# Patient Record
Sex: Female | Born: 1940 | Race: Black or African American | Hispanic: No | Smoking: Never smoker
Health system: Southern US, Community
[De-identification: ages and names within clinical notes are randomized; demographics above are authoritative.]

## PROBLEM LIST (undated history)

## (undated) DIAGNOSIS — J45909 Unspecified asthma, uncomplicated: Secondary | ICD-10-CM

## (undated) DIAGNOSIS — M542 Cervicalgia: Secondary | ICD-10-CM

## (undated) DIAGNOSIS — Z9889 Other specified postprocedural states: Secondary | ICD-10-CM

## (undated) DIAGNOSIS — I10 Essential (primary) hypertension: Secondary | ICD-10-CM

## (undated) DIAGNOSIS — R609 Edema, unspecified: Secondary | ICD-10-CM

## (undated) DIAGNOSIS — D649 Anemia, unspecified: Secondary | ICD-10-CM

## (undated) DIAGNOSIS — G8929 Other chronic pain: Secondary | ICD-10-CM

## (undated) DIAGNOSIS — R42 Dizziness and giddiness: Secondary | ICD-10-CM

## (undated) DIAGNOSIS — J302 Other seasonal allergic rhinitis: Secondary | ICD-10-CM

## (undated) DIAGNOSIS — R351 Nocturia: Secondary | ICD-10-CM

## (undated) DIAGNOSIS — E785 Hyperlipidemia, unspecified: Secondary | ICD-10-CM

## (undated) DIAGNOSIS — Z860100 Personal history of colon polyps, unspecified: Secondary | ICD-10-CM

## (undated) DIAGNOSIS — R531 Weakness: Secondary | ICD-10-CM

## (undated) DIAGNOSIS — E119 Type 2 diabetes mellitus without complications: Secondary | ICD-10-CM

## (undated) DIAGNOSIS — R600 Localized edema: Secondary | ICD-10-CM

## (undated) DIAGNOSIS — M2550 Pain in unspecified joint: Secondary | ICD-10-CM

## (undated) DIAGNOSIS — M1990 Unspecified osteoarthritis, unspecified site: Secondary | ICD-10-CM

## (undated) DIAGNOSIS — E039 Hypothyroidism, unspecified: Secondary | ICD-10-CM

## (undated) DIAGNOSIS — K219 Gastro-esophageal reflux disease without esophagitis: Secondary | ICD-10-CM

## (undated) DIAGNOSIS — R350 Frequency of micturition: Secondary | ICD-10-CM

## (undated) DIAGNOSIS — M549 Dorsalgia, unspecified: Secondary | ICD-10-CM

## (undated) DIAGNOSIS — M2540 Effusion, unspecified joint: Secondary | ICD-10-CM

## (undated) DIAGNOSIS — H269 Unspecified cataract: Secondary | ICD-10-CM

## (undated) DIAGNOSIS — R112 Nausea with vomiting, unspecified: Secondary | ICD-10-CM

## (undated) HISTORY — PX: CHOLECYSTECTOMY: SHX55

## (undated) HISTORY — PX: BREAST SURGERY: SHX581

## (undated) HISTORY — PX: DIAGNOSTIC LAPAROSCOPY: SUR761

## (undated) HISTORY — PX: COLONOSCOPY: SHX174

## (undated) HISTORY — PX: ABDOMINAL HYSTERECTOMY: SHX81

## (undated) HISTORY — PX: BREAST BIOPSY: SHX20

## (undated) HISTORY — PX: APPENDECTOMY: SHX54

---

## 2015-12-27 ENCOUNTER — Other Ambulatory Visit: Payer: Self-pay | Admitting: Neurosurgery

## 2016-01-10 ENCOUNTER — Encounter (HOSPITAL_COMMUNITY): Payer: Self-pay

## 2016-01-10 ENCOUNTER — Encounter (HOSPITAL_COMMUNITY)
Admission: RE | Admit: 2016-01-10 | Discharge: 2016-01-10 | Disposition: A | Discharge status: Home / Self Care | Payer: Medicare Other | Source: Ambulatory Visit | Attending: Neurosurgery | Admitting: Neurosurgery

## 2016-01-10 DIAGNOSIS — Z01812 Encounter for preprocedural laboratory examination: Secondary | ICD-10-CM | POA: Diagnosis present

## 2016-01-10 DIAGNOSIS — M4802 Spinal stenosis, cervical region: Secondary | ICD-10-CM | POA: Insufficient documentation

## 2016-01-10 HISTORY — DX: Other chronic pain: G89.29

## 2016-01-10 HISTORY — DX: Localized edema: R60.0

## 2016-01-10 HISTORY — DX: Cervicalgia: M54.2

## 2016-01-10 HISTORY — DX: Personal history of colonic polyps: Z86.010

## 2016-01-10 HISTORY — DX: Weakness: R53.1

## 2016-01-10 HISTORY — DX: Personal history of colon polyps, unspecified: Z86.0100

## 2016-01-10 HISTORY — DX: Gastro-esophageal reflux disease without esophagitis: K21.9

## 2016-01-10 HISTORY — DX: Type 2 diabetes mellitus without complications: E11.9

## 2016-01-10 HISTORY — DX: Hypothyroidism, unspecified: E03.9

## 2016-01-10 HISTORY — DX: Other specified postprocedural states: Z98.890

## 2016-01-10 HISTORY — DX: Other specified postprocedural states: R11.2

## 2016-01-10 HISTORY — DX: Unspecified asthma, uncomplicated: J45.909

## 2016-01-10 HISTORY — DX: Essential (primary) hypertension: I10

## 2016-01-10 HISTORY — DX: Dorsalgia, unspecified: M54.9

## 2016-01-10 HISTORY — DX: Hyperlipidemia, unspecified: E78.5

## 2016-01-10 HISTORY — DX: Frequency of micturition: R35.0

## 2016-01-10 HISTORY — DX: Nocturia: R35.1

## 2016-01-10 HISTORY — DX: Other seasonal allergic rhinitis: J30.2

## 2016-01-10 HISTORY — DX: Dizziness and giddiness: R42

## 2016-01-10 HISTORY — DX: Edema, unspecified: R60.9

## 2016-01-10 HISTORY — DX: Anemia, unspecified: D64.9

## 2016-01-10 HISTORY — DX: Unspecified cataract: H26.9

## 2016-01-10 HISTORY — DX: Pain in unspecified joint: M25.50

## 2016-01-10 HISTORY — DX: Effusion, unspecified joint: M25.40

## 2016-01-10 HISTORY — DX: Unspecified osteoarthritis, unspecified site: M19.90

## 2016-01-10 LAB — CBC
HCT: 35.1 % — ABNORMAL LOW (ref 36.0–46.0)
Hemoglobin: 11 g/dL — ABNORMAL LOW (ref 12.0–15.0)
MCH: 29 pg (ref 26.0–34.0)
MCHC: 31.3 g/dL (ref 30.0–36.0)
MCV: 92.6 fL (ref 78.0–100.0)
PLATELETS: 133 10*3/uL — AB (ref 150–400)
RBC: 3.79 MIL/uL — ABNORMAL LOW (ref 3.87–5.11)
RDW: 15.7 % — AB (ref 11.5–15.5)
WBC: 6.4 10*3/uL (ref 4.0–10.5)

## 2016-01-10 LAB — BASIC METABOLIC PANEL
Anion gap: 6 (ref 5–15)
BUN: 14 mg/dL (ref 6–20)
CHLORIDE: 114 mmol/L — AB (ref 101–111)
CO2: 20 mmol/L — ABNORMAL LOW (ref 22–32)
CREATININE: 1.08 mg/dL — AB (ref 0.44–1.00)
Calcium: 9.6 mg/dL (ref 8.9–10.3)
GFR calc Af Amer: 58 mL/min — ABNORMAL LOW (ref >60)
GFR, EST NON AFRICAN AMERICAN: 50 mL/min — AB (ref >60)
Glucose, Bld: 107 mg/dL — ABNORMAL HIGH (ref 65–99)
Potassium: 3.9 mmol/L (ref 3.5–5.1)
SODIUM: 140 mmol/L (ref 135–145)

## 2016-01-10 LAB — SURGICAL PCR SCREEN
MRSA, PCR: NEGATIVE
Staphylococcus aureus: POSITIVE — AB

## 2016-01-10 LAB — GLUCOSE, CAPILLARY: GLUCOSE-CAPILLARY: 93 mg/dL (ref 65–99)

## 2016-01-10 NOTE — Progress Notes (Signed)
Mupirocin script called into the [facility] in [place].Pt also notified of staph +

## 2016-01-10 NOTE — Pre-Procedure Instructions (Signed)
Dawn Joseph  01/10/2016      [facility] [address] [place] - [address] [place] [id number] Phone: [phone] Fax: [phone]    Your procedure is scheduled on Tues, June 20 @ 12:00 PM  [facility] Admitting at 9:00 AM  Call this number if you have problems the morning of surgery:  [phone]   Remember:  Do not eat food or drink liquids after midnight.  Take these medicines the morning of surgery with A SIP OF WATER Carvedilol(Coreg),Imdur(Isosorbide),Synthroid(Levothyroxine),Claritin(Loratadine),Meclizine(Antivert),and Omeprazole(Prilosec)              No Goody's,BC's,Aleve,Advil,Motrin,Ibuprofen,Fish Oil,or any Herbal Medications.    Do not wear jewelry, make-up or nail polish.  Do not wear lotions, powders, or perfumes.    Do not shave 48 hours prior to surgery.    Do not bring valuables to the hospital.  [facility] is not responsible for any belongings or valuables.  Contacts, dentures or bridgework may not be worn into surgery.  Leave your suitcase in the car.  After surgery it may be brought to your room.  For patients admitted to the hospital, discharge time will be determined by your treatment team.  Patients discharged the day of surgery will not be allowed to drive home.    [facility]  Before surgery, you can play an important role.  Because skin is not sterile, your skin needs to be as free of germs as possible.  You can reduce the number of germs on you skin by washing with CHG (chlorahexidine gluconate) soap before surgery.  CHG is an antiseptic cleaner which kills germs and bonds with the skin to continue killing germs even after washing.  Please DO NOT use if you have an allergy to CHG or antibacterial soaps.  If your skin becomes reddened/irritated stop using the CHG and inform your nurse when you arrive at Short Stay.  Do not shave (including legs and underarms) for  at least 48 hours prior to the first CHG shower.  You may shave your face.  Please follow these instructions carefully:   1.  Shower with CHG Soap the night before surgery and the                                morning of Surgery.  2.  If you choose to wash your hair, wash your hair first as usual with your       normal shampoo.  3.  After you shampoo, rinse your hair and body thoroughly to remove the                      Shampoo.  4.  Use CHG as you would any other liquid soap.  You can apply chg directly       to the skin and wash gently with scrungie or a clean washcloth.  5.  Apply the CHG Soap to your body ONLY FROM THE NECK DOWN.        Do not use on open wounds or open sores.  Avoid contact with your eyes,       ears, mouth and genitals (private parts).  Wash genitals (private parts)       with your normal soap.  6.  Wash thoroughly, paying special attention to the area where your surgery  will be performed.  7.  Thoroughly rinse your body with warm water from the neck down.  8.  DO NOT shower/wash with your normal soap after using and rinsing off       the CHG Soap.  9.  Pat yourself dry with a clean towel.            10.  Wear clean pajamas.            11.  Place clean sheets on your bed the night of your first shower and do not        sleep with pets.  Day of Surgery  Do not apply any lotions/deoderants the morning of surgery.  Please wear clean clothes to the hospital/surgery center.  :    Please read over the following fact sheets that you were given. MRSA Information

## 2016-01-10 NOTE — Progress Notes (Addendum)
Cardiologist is with [facility] in [place]. Saw around Jan 2017  Medical Md is Dr.Trevetti  Echo to request from [facility]  Stress test thinks > 5 yrs but will check with [facility]  Heart cath denies  EKG to request from [facility]  CXR denies

## 2016-01-11 LAB — HEMOGLOBIN A1C
Hgb A1c MFr Bld: 6.1 % — ABNORMAL HIGH (ref 4.8–5.6)
MEAN PLASMA GLUCOSE: 128 mg/dL

## 2016-01-16 MED ORDER — CEFAZOLIN SODIUM-DEXTROSE 2-4 GM/100ML-% IV SOLN
2.0000 g | INTRAVENOUS | Status: AC
  Administered 2016-01-17: 2 g via INTRAVENOUS
  Filled 2016-01-16: qty 100

## 2016-01-16 NOTE — Discharge Instructions (Signed)

## 2016-01-17 ENCOUNTER — Inpatient Hospital Stay (HOSPITAL_COMMUNITY): Payer: Medicare Other | Admitting: Certified Registered Nurse Anesthetist

## 2016-01-17 ENCOUNTER — Inpatient Hospital Stay (HOSPITAL_COMMUNITY): Payer: Medicare Other

## 2016-01-17 ENCOUNTER — Encounter (HOSPITAL_COMMUNITY)
Admission: RE | Disposition: A | Discharge status: Home Health | Payer: Self-pay | Source: Ambulatory Visit | Attending: Neurosurgery

## 2016-01-17 ENCOUNTER — Encounter (HOSPITAL_COMMUNITY): Payer: Self-pay | Admitting: Certified Registered Nurse Anesthetist

## 2016-01-17 ENCOUNTER — Inpatient Hospital Stay (HOSPITAL_COMMUNITY)
Admission: RE | Admit: 2016-01-17 | Discharge: 2016-01-18 | DRG: 472 | Disposition: A | Discharge status: Home Health | Payer: Medicare Other | Source: Ambulatory Visit | Attending: Neurosurgery | Admitting: Neurosurgery

## 2016-01-17 DIAGNOSIS — M4602 Spinal enthesopathy, cervical region: Secondary | ICD-10-CM | POA: Diagnosis present

## 2016-01-17 DIAGNOSIS — Z419 Encounter for procedure for purposes other than remedying health state, unspecified: Secondary | ICD-10-CM

## 2016-01-17 DIAGNOSIS — M50021 Cervical disc disorder at C4-C5 level with myelopathy: Secondary | ICD-10-CM | POA: Diagnosis present

## 2016-01-17 DIAGNOSIS — M5416 Radiculopathy, lumbar region: Secondary | ICD-10-CM | POA: Diagnosis present

## 2016-01-17 DIAGNOSIS — M109 Gout, unspecified: Secondary | ICD-10-CM | POA: Diagnosis present

## 2016-01-17 DIAGNOSIS — M1990 Unspecified osteoarthritis, unspecified site: Secondary | ICD-10-CM | POA: Diagnosis present

## 2016-01-17 DIAGNOSIS — M50121 Cervical disc disorder at C4-C5 level with radiculopathy: Secondary | ICD-10-CM | POA: Diagnosis present

## 2016-01-17 DIAGNOSIS — Z7982 Long term (current) use of aspirin: Secondary | ICD-10-CM | POA: Diagnosis not present

## 2016-01-17 DIAGNOSIS — M4802 Spinal stenosis, cervical region: Principal | ICD-10-CM | POA: Diagnosis present

## 2016-01-17 DIAGNOSIS — E119 Type 2 diabetes mellitus without complications: Secondary | ICD-10-CM | POA: Diagnosis present

## 2016-01-17 DIAGNOSIS — Z6839 Body mass index (BMI) 39.0-39.9, adult: Secondary | ICD-10-CM | POA: Diagnosis not present

## 2016-01-17 DIAGNOSIS — K219 Gastro-esophageal reflux disease without esophagitis: Secondary | ICD-10-CM | POA: Diagnosis present

## 2016-01-17 DIAGNOSIS — I10 Essential (primary) hypertension: Secondary | ICD-10-CM | POA: Diagnosis present

## 2016-01-17 DIAGNOSIS — Z79899 Other long term (current) drug therapy: Secondary | ICD-10-CM

## 2016-01-17 DIAGNOSIS — J45909 Unspecified asthma, uncomplicated: Secondary | ICD-10-CM | POA: Diagnosis present

## 2016-01-17 DIAGNOSIS — E669 Obesity, unspecified: Secondary | ICD-10-CM | POA: Diagnosis present

## 2016-01-17 DIAGNOSIS — M542 Cervicalgia: Secondary | ICD-10-CM | POA: Diagnosis present

## 2016-01-17 HISTORY — PX: ANTERIOR CERVICAL DECOMP/DISCECTOMY FUSION: SHX1161

## 2016-01-17 LAB — GLUCOSE, CAPILLARY
GLUCOSE-CAPILLARY: 190 mg/dL — AB (ref 65–99)
Glucose-Capillary: 108 mg/dL — ABNORMAL HIGH (ref 65–99)
Glucose-Capillary: 91 mg/dL (ref 65–99)
Glucose-Capillary: 134 mg/dL — ABNORMAL HIGH (ref 65–99)

## 2016-01-17 SURGERY — ANTERIOR CERVICAL DECOMPRESSION/DISCECTOMY FUSION 3 LEVELS
Anesthesia: General

## 2016-01-17 MED ORDER — SODIUM CHLORIDE 0.9 % IV SOLN: 250.0000 mL | INTRAVENOUS | Status: DC

## 2016-01-17 MED ORDER — ALUM & MAG HYDROXIDE-SIMETH 200-200-20 MG/5ML PO SUSP: 30.0000 mL | Freq: Four times a day (QID) | ORAL | Status: DC | PRN

## 2016-01-17 MED ORDER — HYDRALAZINE HCL 20 MG/ML IJ SOLN
INTRAMUSCULAR | Status: AC
  Filled 2016-01-17: qty 1

## 2016-01-17 MED ORDER — PANTOPRAZOLE SODIUM 40 MG PO TBEC
40.0000 mg | DELAYED_RELEASE_TABLET | Freq: Every day | ORAL | Status: DC
  Administered 2016-01-18: 40 mg via ORAL
  Filled 2016-01-17: qty 1

## 2016-01-17 MED ORDER — SODIUM CHLORIDE 0.9% FLUSH: 3.0000 mL | INTRAVENOUS | Status: DC | PRN

## 2016-01-17 MED ORDER — LIDOCAINE HCL 4 % EX SOLN
CUTANEOUS | Status: DC | PRN
  Administered 2016-01-17: 4 mL via TOPICAL

## 2016-01-17 MED ORDER — METHOCARBAMOL 500 MG PO TABS
500.0000 mg | ORAL_TABLET | Freq: Four times a day (QID) | ORAL | Status: DC | PRN
  Administered 2016-01-17 – 2016-01-18 (×3): 500 mg via ORAL
  Filled 2016-01-17 (×3): qty 1

## 2016-01-17 MED ORDER — MIDAZOLAM HCL 2 MG/2ML IJ SOLN
INTRAMUSCULAR | Status: AC
  Filled 2016-01-17: qty 2

## 2016-01-17 MED ORDER — FENTANYL CITRATE (PF) 100 MCG/2ML IJ SOLN
INTRAMUSCULAR | Status: AC
  Filled 2016-01-17: qty 2

## 2016-01-17 MED ORDER — FENTANYL CITRATE (PF) 100 MCG/2ML IJ SOLN
25.0000 ug | INTRAMUSCULAR | Status: DC | PRN
  Administered 2016-01-17: 25 ug via INTRAVENOUS
  Administered 2016-01-17: 50 ug via INTRAVENOUS

## 2016-01-17 MED ORDER — FLEET ENEMA 7-19 GM/118ML RE ENEM: 1.0000 | ENEMA | Freq: Once | RECTAL | Status: DC | PRN

## 2016-01-17 MED ORDER — HYDRALAZINE HCL 20 MG/ML IJ SOLN: 5.0000 mg | INTRAMUSCULAR | Status: DC

## 2016-01-17 MED ORDER — ISOSORBIDE MONONITRATE ER 60 MG PO TB24
60.0000 mg | ORAL_TABLET | Freq: Every day | ORAL | Status: DC
  Administered 2016-01-17: 60 mg via ORAL
  Filled 2016-01-17: qty 1

## 2016-01-17 MED ORDER — DOCUSATE SODIUM 100 MG PO CAPS
100.0000 mg | ORAL_CAPSULE | Freq: Two times a day (BID) | ORAL | Status: DC
  Administered 2016-01-17 – 2016-01-18 (×2): 100 mg via ORAL
  Filled 2016-01-17 (×2): qty 1

## 2016-01-17 MED ORDER — ONDANSETRON HCL 4 MG/2ML IJ SOLN: 4.0000 mg | Freq: Once | INTRAMUSCULAR | Status: DC | PRN

## 2016-01-17 MED ORDER — LIDOCAINE HCL (CARDIAC) 20 MG/ML IV SOLN
INTRAVENOUS | Status: DC | PRN
  Administered 2016-01-17: 100 mg via INTRAVENOUS

## 2016-01-17 MED ORDER — ASPIRIN EC 81 MG PO TBEC
81.0000 mg | DELAYED_RELEASE_TABLET | Freq: Every day | ORAL | Status: DC
  Administered 2016-01-17 – 2016-01-18 (×2): 81 mg via ORAL
  Filled 2016-01-17 (×2): qty 1

## 2016-01-17 MED ORDER — PROPOFOL 10 MG/ML IV BOLUS
INTRAVENOUS | Status: DC | PRN
  Administered 2016-01-17: 100 mg via INTRAVENOUS

## 2016-01-17 MED ORDER — OXYCODONE-ACETAMINOPHEN 5-325 MG PO TABS
1.0000 | ORAL_TABLET | ORAL | Status: DC | PRN
  Administered 2016-01-17: 2 via ORAL
  Administered 2016-01-18: 1 via ORAL
  Administered 2016-01-18: 2 via ORAL
  Filled 2016-01-17: qty 2
  Filled 2016-01-17: qty 1
  Filled 2016-01-17: qty 2

## 2016-01-17 MED ORDER — HYDROCODONE-ACETAMINOPHEN 5-325 MG PO TABS: 1.0000 | ORAL_TABLET | ORAL | Status: DC | PRN

## 2016-01-17 MED ORDER — HYDRALAZINE HCL 20 MG/ML IJ SOLN
INTRAMUSCULAR | Status: DC | PRN
  Administered 2016-01-17 (×2): 5 mg via INTRAVENOUS

## 2016-01-17 MED ORDER — FENTANYL CITRATE (PF) 100 MCG/2ML IJ SOLN
INTRAMUSCULAR | Status: DC | PRN
  Administered 2016-01-17: 50 ug via INTRAVENOUS
  Administered 2016-01-17: 100 ug via INTRAVENOUS
  Administered 2016-01-17: 50 ug via INTRAVENOUS

## 2016-01-17 MED ORDER — CYANOCOBALAMIN 500 MCG PO TABS
500.0000 ug | ORAL_TABLET | Freq: Every day | ORAL | Status: DC
  Administered 2016-01-17 – 2016-01-18 (×2): 500 ug via ORAL
  Filled 2016-01-17 (×2): qty 1

## 2016-01-17 MED ORDER — ISOSORBIDE MONONITRATE ER 60 MG PO TB24: 120.0000 mg | ORAL_TABLET | Freq: Every day | ORAL | Status: DC

## 2016-01-17 MED ORDER — MECLIZINE HCL 12.5 MG PO TABS
12.5000 mg | ORAL_TABLET | Freq: Every day | ORAL | Status: DC
  Administered 2016-01-18: 12.5 mg via ORAL
  Filled 2016-01-17: qty 1

## 2016-01-17 MED ORDER — LACTATED RINGERS IV SOLN
INTRAVENOUS | Status: DC
  Administered 2016-01-17 (×2): via INTRAVENOUS

## 2016-01-17 MED ORDER — 0.9 % SODIUM CHLORIDE (POUR BTL) OPTIME
TOPICAL | Status: DC | PRN
  Administered 2016-01-17: 1000 mL

## 2016-01-17 MED ORDER — BISACODYL 10 MG RE SUPP: 10.0000 mg | Freq: Every day | RECTAL | Status: DC | PRN

## 2016-01-17 MED ORDER — THROMBIN 20000 UNITS EX SOLR
CUTANEOUS | Status: DC | PRN
  Administered 2016-01-17: 14:00:00 via TOPICAL

## 2016-01-17 MED ORDER — PANCRELIPASE (LIP-PROT-AMYL) 12000-38000 UNITS PO CPEP
2.0000 | ORAL_CAPSULE | Freq: Every day | ORAL | Status: DC
  Filled 2016-01-17: qty 2

## 2016-01-17 MED ORDER — ACETAMINOPHEN 325 MG PO TABS: 650.0000 mg | ORAL_TABLET | ORAL | Status: DC | PRN

## 2016-01-17 MED ORDER — FERROUS SULFATE 325 (65 FE) MG PO TABS
325.0000 mg | ORAL_TABLET | Freq: Two times a day (BID) | ORAL | Status: DC
  Administered 2016-01-18: 325 mg via ORAL
  Filled 2016-01-17: qty 1

## 2016-01-17 MED ORDER — NATEGLINIDE 120 MG PO TABS
120.0000 mg | ORAL_TABLET | Freq: Two times a day (BID) | ORAL | Status: DC
  Administered 2016-01-18: 120 mg via ORAL
  Filled 2016-01-17 (×2): qty 1

## 2016-01-17 MED ORDER — LIDOCAINE-EPINEPHRINE 1 %-1:100000 IJ SOLN
INTRAMUSCULAR | Status: DC | PRN
  Administered 2016-01-17: 7 mL

## 2016-01-17 MED ORDER — CEFAZOLIN SODIUM 1-5 GM-% IV SOLN
1.0000 g | Freq: Three times a day (TID) | INTRAVENOUS | Status: AC
  Administered 2016-01-17 – 2016-01-18 (×2): 1 g via INTRAVENOUS
  Filled 2016-01-17 (×2): qty 50

## 2016-01-17 MED ORDER — PROPOFOL 10 MG/ML IV BOLUS
INTRAVENOUS | Status: AC
  Filled 2016-01-17: qty 20

## 2016-01-17 MED ORDER — ZOLPIDEM TARTRATE 5 MG PO TABS: 5.0000 mg | ORAL_TABLET | Freq: Every evening | ORAL | Status: DC | PRN

## 2016-01-17 MED ORDER — CO Q 10 100 MG PO CAPS: 1.0000 | ORAL_CAPSULE | Freq: Every day | ORAL | Status: DC

## 2016-01-17 MED ORDER — ONDANSETRON HCL 4 MG/2ML IJ SOLN
INTRAMUSCULAR | Status: DC | PRN
  Administered 2016-01-17: 4 mg via INTRAVENOUS

## 2016-01-17 MED ORDER — BUPIVACAINE HCL (PF) 0.5 % IJ SOLN
INTRAMUSCULAR | Status: DC | PRN
  Administered 2016-01-17: 7 mL

## 2016-01-17 MED ORDER — ONDANSETRON HCL 4 MG/2ML IJ SOLN: 4.0000 mg | INTRAMUSCULAR | Status: DC | PRN

## 2016-01-17 MED ORDER — FUROSEMIDE 20 MG PO TABS
20.0000 mg | ORAL_TABLET | Freq: Every day | ORAL | Status: DC
  Administered 2016-01-18: 20 mg via ORAL
  Filled 2016-01-17: qty 1

## 2016-01-17 MED ORDER — GLYCOPYRROLATE 0.2 MG/ML IJ SOLN
INTRAMUSCULAR | Status: DC | PRN
  Administered 2016-01-17: 0.4 mg via INTRAVENOUS

## 2016-01-17 MED ORDER — CARVEDILOL 6.25 MG PO TABS
25.0000 mg | ORAL_TABLET | Freq: Two times a day (BID) | ORAL | Status: DC
  Administered 2016-01-17 – 2016-01-18 (×2): 25 mg via ORAL
  Filled 2016-01-17 (×2): qty 4

## 2016-01-17 MED ORDER — ATORVASTATIN CALCIUM 20 MG PO TABS
40.0000 mg | ORAL_TABLET | Freq: Every day | ORAL | Status: DC
  Administered 2016-01-17 – 2016-01-18 (×2): 40 mg via ORAL
  Filled 2016-01-17 (×2): qty 2

## 2016-01-17 MED ORDER — HYDROMORPHONE HCL 1 MG/ML IJ SOLN: 0.5000 mg | INTRAMUSCULAR | Status: DC | PRN

## 2016-01-17 MED ORDER — PHENOL 1.4 % MT LIQD: 1.0000 | OROMUCOSAL | Status: DC | PRN

## 2016-01-17 MED ORDER — LORATADINE 10 MG PO TABS
10.0000 mg | ORAL_TABLET | Freq: Every day | ORAL | Status: DC
  Administered 2016-01-18: 10 mg via ORAL
  Filled 2016-01-17: qty 1

## 2016-01-17 MED ORDER — SODIUM CHLORIDE 0.9% FLUSH
3.0000 mL | Freq: Two times a day (BID) | INTRAVENOUS | Status: DC
  Administered 2016-01-17: 3 mL via INTRAVENOUS

## 2016-01-17 MED ORDER — THROMBIN 5000 UNITS EX SOLR
OROMUCOSAL | Status: DC | PRN
  Administered 2016-01-17: 15:00:00 via TOPICAL

## 2016-01-17 MED ORDER — FAMOTIDINE IN NACL 20-0.9 MG/50ML-% IV SOLN
20.0000 mg | Freq: Two times a day (BID) | INTRAVENOUS | Status: DC
  Administered 2016-01-17: 20 mg via INTRAVENOUS
  Filled 2016-01-17 (×3): qty 50

## 2016-01-17 MED ORDER — VITAMIN D 1000 UNITS PO TABS
2000.0000 [IU] | ORAL_TABLET | Freq: Every day | ORAL | Status: DC
  Administered 2016-01-17 – 2016-01-18 (×2): 2000 [IU] via ORAL
  Filled 2016-01-17: qty 2

## 2016-01-17 MED ORDER — MENTHOL 3 MG MT LOZG: 1.0000 | LOZENGE | OROMUCOSAL | Status: DC | PRN

## 2016-01-17 MED ORDER — ROCURONIUM BROMIDE 100 MG/10ML IV SOLN
INTRAVENOUS | Status: DC | PRN
  Administered 2016-01-17: 20 mg via INTRAVENOUS
  Administered 2016-01-17: 50 mg via INTRAVENOUS

## 2016-01-17 MED ORDER — DEXAMETHASONE SODIUM PHOSPHATE 4 MG/ML IJ SOLN
INTRAMUSCULAR | Status: DC | PRN
  Administered 2016-01-17: 10 mg via INTRAVENOUS

## 2016-01-17 MED ORDER — KCL IN DEXTROSE-NACL 20-5-0.45 MEQ/L-%-% IV SOLN: INTRAVENOUS | Status: DC

## 2016-01-17 MED ORDER — ACETAMINOPHEN 650 MG RE SUPP: 650.0000 mg | RECTAL | Status: DC | PRN

## 2016-01-17 MED ORDER — DEXTROSE 5 % IV SOLN
10.0000 mg | INTRAVENOUS | Status: DC | PRN
  Administered 2016-01-17: 15 ug/min via INTRAVENOUS

## 2016-01-17 MED ORDER — LEVOTHYROXINE SODIUM 100 MCG PO TABS
50.0000 ug | ORAL_TABLET | Freq: Every day | ORAL | Status: DC
  Administered 2016-01-18: 50 ug via ORAL
  Filled 2016-01-17: qty 1

## 2016-01-17 MED ORDER — SENNOSIDES-DOCUSATE SODIUM 8.6-50 MG PO TABS: 1.0000 | ORAL_TABLET | Freq: Every evening | ORAL | Status: DC | PRN

## 2016-01-17 MED ORDER — SUGAMMADEX SODIUM 200 MG/2ML IV SOLN
INTRAVENOUS | Status: DC | PRN
  Administered 2016-01-17: 200 mg via INTRAVENOUS

## 2016-01-17 MED ORDER — DEXTROSE 5 % IV SOLN: 10.0000 mg | INTRAVENOUS | Status: DC | PRN

## 2016-01-17 MED ORDER — FENTANYL CITRATE (PF) 250 MCG/5ML IJ SOLN
INTRAMUSCULAR | Status: AC
  Filled 2016-01-17: qty 5

## 2016-01-17 MED ORDER — METHOCARBAMOL 1000 MG/10ML IJ SOLN
500.0000 mg | Freq: Four times a day (QID) | INTRAVENOUS | Status: DC | PRN
  Filled 2016-01-17: qty 5

## 2016-01-17 MED ORDER — ACETAZOLAMIDE 250 MG PO TABS
250.0000 mg | ORAL_TABLET | Freq: Every day | ORAL | Status: DC
  Administered 2016-01-17 – 2016-01-18 (×2): 250 mg via ORAL
  Filled 2016-01-17 (×2): qty 1

## 2016-01-17 MED ORDER — POTASSIUM CHLORIDE ER 10 MEQ PO TBCR
10.0000 meq | EXTENDED_RELEASE_TABLET | Freq: Every day | ORAL | Status: DC
  Administered 2016-01-17 – 2016-01-18 (×2): 10 meq via ORAL
  Filled 2016-01-17 (×3): qty 1

## 2016-01-17 SURGICAL SUPPLY — 75 items
BENZOIN TINCTURE PRP APPL 2/3 (GAUZE/BANDAGES/DRESSINGS) ×3 IMPLANT
BIT DRILL 12X2.5XAVTR (BIT) ×1 IMPLANT
BIT DRILL AVIATOR 12 (BIT) ×1
BIT DRILL NEURO 2X3.1 SFT TUCH (MISCELLANEOUS) ×1 IMPLANT
BIT DRL 12X2.5XAVTR (BIT) ×1
BLADE ULTRA TIP 2M (BLADE) IMPLANT
BNDG GAUZE ELAST 4 BULKY (GAUZE/BANDAGES/DRESSINGS) IMPLANT
BUR BARREL STRAIGHT FLUTE 4.0 (BURR) ×6 IMPLANT
CANISTER SUCT 3000ML PPV (MISCELLANEOUS) ×3 IMPLANT
CLOSURE WOUND 1/2 X4 (GAUZE/BANDAGES/DRESSINGS) ×1
COVER MAYO STAND STRL (DRAPES) ×3 IMPLANT
DECANTER SPIKE VIAL GLASS SM (MISCELLANEOUS) ×3 IMPLANT
DERMABOND ADVANCED (GAUZE/BANDAGES/DRESSINGS) ×2
DERMABOND ADVANCED .7 DNX12 (GAUZE/BANDAGES/DRESSINGS) ×1 IMPLANT
DRAPE LAPAROTOMY 100X72 PEDS (DRAPES) ×3 IMPLANT
DRAPE MICROSCOPE LEICA (MISCELLANEOUS) ×3 IMPLANT
DRAPE POUCH INSTRU U-SHP 10X18 (DRAPES) ×3 IMPLANT
DRAPE PROXIMA HALF (DRAPES) IMPLANT
DRILL NEURO 2X3.1 SOFT TOUCH (MISCELLANEOUS) ×3
DRSG OPSITE POSTOP 3X4 (GAUZE/BANDAGES/DRESSINGS) ×3 IMPLANT
DURAPREP 6ML APPLICATOR 50/CS (WOUND CARE) ×3 IMPLANT
ELECT COATED BLADE 2.86 ST (ELECTRODE) ×3 IMPLANT
ELECT REM PT RETURN 9FT ADLT (ELECTROSURGICAL) ×3
ELECTRODE REM PT RTRN 9FT ADLT (ELECTROSURGICAL) ×1 IMPLANT
GAUZE SPONGE 4X4 12PLY STRL (GAUZE/BANDAGES/DRESSINGS) IMPLANT
GAUZE SPONGE 4X4 16PLY XRAY LF (GAUZE/BANDAGES/DRESSINGS) IMPLANT
GLOVE BIO SURGEON STRL SZ8 (GLOVE) ×3 IMPLANT
GLOVE BIOGEL PI IND STRL 6.5 (GLOVE) ×2 IMPLANT
GLOVE BIOGEL PI IND STRL 8 (GLOVE) ×1 IMPLANT
GLOVE BIOGEL PI IND STRL 8.5 (GLOVE) ×1 IMPLANT
GLOVE BIOGEL PI INDICATOR 6.5 (GLOVE) ×4
GLOVE BIOGEL PI INDICATOR 8 (GLOVE) ×2
GLOVE BIOGEL PI INDICATOR 8.5 (GLOVE) ×2
GLOVE ECLIPSE 7.0 STRL STRAW (GLOVE) ×3 IMPLANT
GLOVE ECLIPSE 8.0 STRL XLNG CF (GLOVE) ×3 IMPLANT
GLOVE EXAM NITRILE LRG STRL (GLOVE) IMPLANT
GLOVE EXAM NITRILE MD LF STRL (GLOVE) IMPLANT
GLOVE EXAM NITRILE XL STR (GLOVE) IMPLANT
GLOVE EXAM NITRILE XS STR PU (GLOVE) IMPLANT
GLOVE INDICATOR 7.0 STRL GRN (GLOVE) ×12 IMPLANT
GLOVE INDICATOR 7.5 STRL GRN (GLOVE) ×6 IMPLANT
GOWN STRL REUS W/ TWL LRG LVL3 (GOWN DISPOSABLE) ×1 IMPLANT
GOWN STRL REUS W/ TWL XL LVL3 (GOWN DISPOSABLE) ×1 IMPLANT
GOWN STRL REUS W/TWL 2XL LVL3 (GOWN DISPOSABLE) ×3 IMPLANT
GOWN STRL REUS W/TWL LRG LVL3 (GOWN DISPOSABLE) ×2
GOWN STRL REUS W/TWL XL LVL3 (GOWN DISPOSABLE) ×2
HALTER HD/CHIN CERV TRACTION D (MISCELLANEOUS) ×3 IMPLANT
HEMOSTAT POWDER SURGIFOAM 1G (HEMOSTASIS) ×3 IMPLANT
KIT BASIN OR (CUSTOM PROCEDURE TRAY) ×3 IMPLANT
KIT ROOM TURNOVER OR (KITS) ×3 IMPLANT
NEEDLE HYPO 25X1 1.5 SAFETY (NEEDLE) ×3 IMPLANT
NEEDLE SPNL 18GX3.5 QUINCKE PK (NEEDLE) ×3 IMPLANT
NEEDLE SPNL 22GX3.5 QUINCKE BK (NEEDLE) ×6 IMPLANT
NS IRRIG 1000ML POUR BTL (IV SOLUTION) ×3 IMPLANT
PACK LAMINECTOMY NEURO (CUSTOM PROCEDURE TRAY) ×3 IMPLANT
PAD ARMBOARD 7.5X6 YLW CONV (MISCELLANEOUS) ×9 IMPLANT
PIN DISTRACTION 14MM (PIN) ×6 IMPLANT
PLATE AVIATOR ASSY 3LVL SZ 42 (Plate) ×3 IMPLANT
PUTTY DBX 1CC (Putty) ×3 IMPLANT
PUTTY DBX 1CC DEPUY (Putty) ×1 IMPLANT
RUBBERBAND STERILE (MISCELLANEOUS) ×6 IMPLANT
SCREW AVIATOR VAR SELFTAP 4X12 (Screw) ×24 IMPLANT
SPACER AVS AS 12X14X5X0 (Orthopedic Implant) ×6 IMPLANT
SPACER AVS AS 4X12X14X4 (Spacer) ×3 IMPLANT
SPONGE INTESTINAL PEANUT (DISPOSABLE) ×6 IMPLANT
SPONGE SURGIFOAM ABS GEL 100 (HEMOSTASIS) ×3 IMPLANT
STAPLER SKIN PROX WIDE 3.9 (STAPLE) IMPLANT
STRIP CLOSURE SKIN 1/2X4 (GAUZE/BANDAGES/DRESSINGS) ×2 IMPLANT
SUT VIC AB 3-0 SH 8-18 (SUTURE) ×3 IMPLANT
SYR 5ML LL (SYRINGE) IMPLANT
TOWEL OR 17X24 6PK STRL BLUE (TOWEL DISPOSABLE) ×3 IMPLANT
TOWEL OR 17X26 10 PK STRL BLUE (TOWEL DISPOSABLE) ×3 IMPLANT
TRAP SPECIMEN MUCOUS 40CC (MISCELLANEOUS) ×3 IMPLANT
TRAY FOLEY W/METER SILVER 16FR (SET/KITS/TRAYS/PACK) IMPLANT
WATER STERILE IRR 1000ML POUR (IV SOLUTION) ×3 IMPLANT

## 2016-01-17 NOTE — Anesthesia Preprocedure Evaluation (Addendum)
Anesthesia Evaluation  Patient identified by MRN, date of birth, ID band Patient awake    Reviewed: Allergy & Precautions, NPO status , Patient's Chart, lab work & pertinent test results, reviewed documented beta blocker date and time   History of Anesthesia Complications (+) PONV and history of anesthetic complications  Airway Mallampati: II  TM Distance: >3 FB Neck ROM: Limited    Dental  (+) Teeth Intact, Dental Advisory Given, Missing   Pulmonary asthma (albuterol PRN) ,    Pulmonary exam normal breath sounds clear to auscultation       Cardiovascular hypertension, Pt. on home beta blockers and Pt. on medications Normal cardiovascular exam Rhythm:Regular Rate:Normal     Neuro/Psych Cervical stenosis BUE weakness  negative neurological ROS  negative psych ROS   GI/Hepatic Neg liver ROS, GERD  Medicated,  Endo/Other  diabetes, Type 2, Oral Hypoglycemic AgentsHypothyroidism Obesity   Renal/GU negative Renal ROS     Musculoskeletal  (+) Arthritis ,   Abdominal   Peds  Hematology  (+) Blood dyscrasia (thrombocytopenia), anemia ,   Anesthesia Other Findings Day of surgery medications reviewed with the patient.  Reproductive/Obstetrics                           Anesthesia Physical Anesthesia Plan  ASA: III  Anesthesia Plan: General   Post-op Pain Management:    Induction: Intravenous  Airway Management Planned: Oral ETT and Video Laryngoscope Planned  Additional Equipment:   Intra-op Plan:   Post-operative Plan: Extubation in OR  Informed Consent: I have reviewed the patients History and Physical, chart, labs and discussed the procedure including the risks, benefits and alternatives for the proposed anesthesia with the patient or authorized representative who has indicated his/her understanding and acceptance.   Dental advisory given  Plan Discussed with: CRNA  Anesthesia  Plan Comments: (Risks/benefits of general anesthesia discussed with patient including risk of damage to teeth, lips, gum, and tongue, nausea/vomiting, allergic reactions to medications, and the possibility of heart attack, stroke and death.  All patient questions answered.  Patient wishes to proceed.)       Anesthesia Quick Evaluation

## 2016-01-17 NOTE — Progress Notes (Signed)
Awake, alert, conversant.  Full strength both upper extremities, D/B/T.  MAEW.   Doing well.

## 2016-01-17 NOTE — Transfer of Care (Signed)
Immediate Anesthesia Transfer of Care Note  Patient: Dawn Joseph  Procedure(s) Performed: Procedure(s): Cervical four-five Cervical five-six Cervical six-seven Anterior cervical decompression/diskectomy/fusion (N/A)  Patient Location: PACU  Anesthesia Type:General  Level of Consciousness: awake, alert  and oriented  Airway & Oxygen Therapy: Patient Spontanous Breathing and Patient connected to nasal cannula oxygen  Post-op Assessment: Report given to RN and Post -op Vital signs reviewed and stable  Post vital signs: Reviewed and stable  Last Vitals:  Filed Vitals:   01/17/16 1705 01/17/16 1708  BP:  173/74  Pulse: 59 57  Temp: 36.3 C   Resp:  17    Last Pain:  Filed Vitals:   01/17/16 1710  PainSc: 5       Patients Stated Pain Goal: 3 (01/17/16 1115)  Complications: No apparent anesthesia complications

## 2016-01-17 NOTE — Interval H&P Note (Signed)
History and Physical Interval Note:  01/17/2016 2:22 PM  Dawn Joseph  has presented today for surgery, with the diagnosis of Cervical stenosis of spinal canal  The various methods of treatment have been discussed with the patient and family. After consideration of risks, benefits and other options for treatment, the patient has consented to  Procedure(s) with comments: C4-5 C5-6 C6-7 Anterior cervical decompression/diskectomy/fusion (N/A) - C4-5 C5-6 C6-7 Anterior cervical decompression/diskectomy/fusion as a surgical intervention .  The patient's history has been reviewed, patient examined, no change in status, stable for surgery.  I have reviewed the patient's chart and labs.  Questions were answered to the patient's satisfaction.     Samella Lucchetti D

## 2016-01-17 NOTE — H&P (Signed)
Patient ID:   [id number] Patient: Dawn Joseph  Date of Birth: 07-09-1941 Visit Type: Office Visit   Date: 12/07/2015 10:45 AM Provider: Danae Orleans. Venetia Maxon MD   This 75 year old female presents for Low back pain and neck pain.  History of Present Illness: 1.  Low back pain  2.  neck pain    Patient returns to review her MRI.   She has hyperflexia and weakness in bilateral finger extensors and hand intrinsics. Positive Hoffman's bilaterally. She has also fallen 4 times due to her weakness.she is hyperreflexic in both knees and has spasticity in her legs  11/11/15 MRI: Multilevel degenerative changes with severe neuroforaminal narrowing extending from C4 through C7. In addition, there is moderate spinal stenosis at C4-C5, C5-C6, and C6-C7. Overall these are relatively stable.      Medical/Surgical/Interim History Reviewed, no change.   PAST MEDICAL HISTORY, SURGICAL HISTORY, FAMILY HISTORY, SOCIAL HISTORY AND REVIEW OF SYSTEMS  10/26/2015, which I have signed.  Family History: Reviewed, no changes.    Social History: Tobacco use reviewed. Reviewed, no changes.     MEDICATIONS(added, continued or stopped this visit): Started Medication Directions Instruction Stopped   acetazolamide 250 mg tablet take 1 tablet by oral route 4 times every day     aspirin 81 mg tablet,delayed release take 1 tablet by oral route  every day     atorvastatin 40 mg tablet take 1 tablet by oral route  every day     baclofen 10 mg tablet take 1 tablet by oral route 3 times every day     carvedilol 25 mg tablet take 1 tablet by oral route 2 times every day with food     Co Q-10 100 mg capsule      Fish Oil 1,000 mg (120 mg-180 mg) capsule take 1 by oral route  every day     Flonase Allergy Relief 50 mcg/actuation nasal spray,suspension spray 1 - 2 spray by intranasal route  every day in each nostril as needed     furosemide 40 mg tablet take 1 tablet by oral route  every day     Gralise 300 mg  tablet,extended release take 1 tablet daily by mouth     isosorbide mononitrate ER 60 mg tablet,extended release 24 hr take 1 tablet by oral route  every day in the morning     levothyroxine 50 mcg tablet take 1 tablet by oral route  every day     loratadine 10 mg disintegrating tablet take 1 tablet by oral route  every day and place on top of the tongue where it will dissolve, then swallow     losartan potassium  ORAL      meclizine 25 mg tablet take 1 tablet by oral route 3 times every day as needed     nateglinide 120 mg tablet take 1 tablet by oral route 3 times every day 1 to 30 minutes prior to meals     potassium chloride ER 10 mEq capsule,extended release take 4 capsule by oral route 2 times every day with food     Vitamin B-12 500 mcg tablet      Vitamin D3 2,000 unit capsule        ALLERGIES: Ingredient Reaction Medication Name Comment  CODEINE Unknown        Vitals Date Temp F BP Pulse Ht In Wt Lb BMI BSA Pain Score  12/07/2015  173/84 53 59.25 198 39.65  8/10  IMPRESSION The patient is experiencing neck and low back pain. On review of her cervical MRI, she has moderate spinal stenosis at the C4-7 levels, which is causing her hyperflexia and weakness in UE and LE. With the severity of the stenosis and worsening symptoms, I recommend surgical intervention at this time.  Completed Orders (this encounter) Order Details Reason Side Interpretation Result Initial Treatment Date Region  Dietary management education, guidance, and counseling Encouraged to eat a well balanced diet and follow up with primary care physician.        Hypertension education Follow up with primary care physician for elevated blood pressure.         Assessment/Plan # Detail Type Description   1. Assessment Body mass index (BMI) 39.0-39.9, adult (Z68.39).   Plan Orders Today's instructions / counseling include(s) Dietary management education, guidance, and counseling.       2. Assessment  Essential (primary) hypertension (I10).       3. Assessment Cervical stenosis of spinal canal (M48.02).         Pain Assessment/Treatment Pain Scale: 8/10. Method: Numeric Pain Intensity Scale. Location: lower back. Onset: 06/28/2015. Duration: varies. Quality: discomforting. Pain Assessment/Treatment follow-up plan of care: Patient is taking medications prescribed.  Fall Risk Plan The Patient has fallen 4 times in the last year. The fall(s) resulted in injury. Details: bruising. Falls risk follow-up plan of care: Assisted devices: Advised to use cane/walker for safety.  Scheduled C4-7 ACDF. Nurse education given.   Orders: Diagnostic Procedures: Assessment Procedure  M48.02 Cervical Spine- Lateral  Instruction(s)/Education: Assessment Instruction  I10 Hypertension education  Z68.39 Dietary management education, guidance, and counseling             Provider:  Danae OrleansJoseph D. Venetia MaxonStern MD  12/07/2015 10:56 AM Dictation edited by: Gabriel RainwaterSamuel F. McBride    CC Providers: Collene LeydenJames Dailey [address] [place], [place] [id number]-              Electronically signed by Danae OrleansJoseph D. Venetia MaxonStern MD on 12/07/2015 11:05 AM  Patient ID:   [id number] Patient: Dawn Joseph  Date of Birth: 05/06/1941 Visit Type: Office Visit   Date: 10/26/2015 02:30 PM Provider: Danae OrleansJoseph D. Venetia MaxonStern MD   This 75 year old female presents for Low back pain.  History of Present Illness: 1.  Low back pain    [name], 75 year old female visits for evaluation of neck, arm, back, & right leg pain.  Patient notes several years of neck pain, lumbar pain began in November 2016.  She recalls no injuries.  Physical therapy offered no relief for her neck Cervical ESI's 2015 offered no lasting relief Lumbar ESI's January 2017 offered no relief  Norco 7.5/325 was changed to Percocet which patient did not tolerate Baclofen 10 mg is noted on referral paperwork; however patient states she no longer  takes  Tylenol 500 mg 2-3 3 times a day  History: RA, NIDDM, GERD, thyroid, asthma, gout, HTN Surgical history: Right carpal tunnel release, hysterectomy, breast cyst excision, appendectomy... All years ago  [facility]umbar [facility]ervical and lumbar [facility]  Cervical X-ray indicates a large spur at C4-5      Medical/Surgical/Interim History Reviewed, no change.   PAST MEDICAL HISTORY, SURGICAL HISTORY, FAMILY HISTORY, SOCIAL HISTORY AND REVIEW OF SYSTEMS  10/26/2015, which I have signed.  Family History: Reviewed, no changes.    Social History: Tobacco use reviewed. Reviewed, no changes.     MEDICATIONS(added, continued or stopped this visit):  Started Medication Directions Instruction Stopped   acetazolamide 250 mg tablet take 1 tablet by oral route 4 times every day     aspirin 81 mg tablet,delayed release take 1 tablet by oral route  every day     atorvastatin 40 mg tablet take 1 tablet by oral route  every day     baclofen 10 mg tablet take 1 tablet by oral route 3 times every day     carvedilol 25 mg tablet take 1 tablet by oral route 2 times every day with food     Co Q-10 100 mg capsule      Fish Oil 1,000 mg (120 mg-180 mg) capsule take 1 by oral route  every day     Flonase Allergy Relief 50 mcg/actuation nasal spray,suspension spray 1 - 2 spray by intranasal route  every day in each nostril as needed     furosemide 40 mg tablet take 1 tablet by oral route  every day     Gralise 300 mg tablet,extended release take 1 tablet daily by mouth     isosorbide mononitrate ER 60 mg tablet,extended release 24 hr take 1 tablet by oral route  every day in the morning     levothyroxine 50 mcg tablet take 1 tablet by oral route  every day     loratadine 10 mg disintegrating tablet take 1 tablet by oral route  every day and place on top of the tongue where it will dissolve, then swallow     losartan potassium 50mg  ORAL      meclizine 25 mg tablet  take 1 tablet by oral route 3 times every day as needed     nateglinide 120 mg tablet take 1 tablet by oral route 3 times every day 1 to 30 minutes prior to meals     potassium chloride ER 10 mEq capsule,extended release take 4 capsule by oral route 2 times every day with food     Vitamin B-12 500 mcg tablet      Vitamin D3 2,000 unit capsule        ALLERGIES: Ingredient Reaction Medication Name Comment  CODEINE Unknown     Reviewed, updated.    Vitals Date Temp F BP Pulse Ht In Wt Lb BMI BSA Pain Score  10/26/2015  191/74 54 59.25 194.4 38.93  9/10     PHYSICAL EXAM General Level of Distress: no acute distress Overall Appearance: normal  Head and Face  Right Left  Fundoscopic Exam:  normal normal    Cardiovascular Cardiac: regular rate and rhythm without murmur  Right Left  Carotid Pulses: normal normal  Respiratory Lungs: clear to auscultation  Neurological Orientation: normal Recent and Remote Memory: normal Attention Span and Concentration:   normal Language: normal Fund of Knowledge: normal  Right Left Sensation: normal normal Upper Extremity Coordination: normal normal  Lower Extremity Coordination: normal normal  Musculoskeletal Gait and Station: normal  Right Left Upper Extremity Muscle Strength: normal normal Lower Extremity Muscle Strength: normal normal Upper Extremity Muscle Tone:  normal normal Lower Extremity Muscle Tone: normal normal  Motor Strength Upper and lower extremity motor strength was tested in the clinically pertinent muscles. Any abnormal findings will be noted below.   Right Left Finger Extensor: 4/5 4/5   Deep Tendon Reflexes  Right Left Biceps: increase increase Triceps: increase increase Brachiloradialis: increase increase Patellar: increase increase Achilles: normal normal  Sensory Sensation was tested at L1 to S1.   Cranial Nerves II. Optic Nerve/Visual Fields:  normal III. Oculomotor: normal IV.  Trochlear: normal V. Trigeminal: normal VI. Abducens: normal VII. Facial: normal VIII. Acoustic/Vestibular: normal IX. Glossopharyngeal: normal X. Vagus: normal XI. Spinal Accessory: normal XII. Hypoglossal: normal  Motor and other Tests Lhermittes: negative Rhomberg: negative Pronator drift: absent     Right Left Hoffman's: present present Clonus: normal normal Babinski: normal normal SLR: positive positive Patrick's Pearlean Brownie): negative negative Toe Walk: normal normal Toe Lift: normal normal Heel Walk: normal normal SI Joint: nontender nontender   Additional Findings:  Significant right knee arthritic pain. Bilateral hand intrinsics are weak. Varus deformity of right knee. Numbness right > left.hand.    DIAGNOSTIC RESULTS 08/04/15 MRI: Multilevel degenerative changes with most significant findings moderate to severe left L4-5, left L3-4 and right L2-3 neural foramen narrowing. L4-5 spondylolisthesis.  X-rays:  Cervical X-ray indicates a large spur at C4-5  Scoliosis. Multilevel spondylolisthesis. Flexion L4-5 7mm, Extension L4-5 7mm, Neutral 0mm    IMPRESSION The patient's imaging indicates significant left L3-4, left L4-5, right L2-3 narrowing likely causing nerve compression which maybe contributing to her right leg pain and less so left leg- she also has patellar hyperreflexia.   In regards to her neck, arm and hand pain, imaging shows evidence of a large spur at C4-5 which maybe causing nerve impingement. Her cervical radiculopathy is supported by positive Hoffman's, UE hyperreflexia and weak hand intrinsics bilaterally. At this time, I will need an updated cervical MRI for further workup. Her cervical spine related pains seem to be greater than her lumbar related pain at this time.  Completed Orders (this encounter) Order Details Reason Side Interpretation Result Initial Treatment Date Region  Hypertension education Patient to follow up with primary care provider.         Lifestyle education regarding diet Patient encouraged to eat a well balanced diet.        Cervical Spine- AP/Lat/Obls/Odontoid/Flex/Ex one of two     10/26/2015 All Levels to All Levels  Lumbar Spine- AP/Lat/Obls/Spot/Flex/Ex two of two     10/26/2015 All Levels to All Levels   Assessment/Plan # Detail Type Description   1. Assessment Essential (primary) hypertension (I10).       2. Assessment Body mass index (BMI) 38.0-38.9, adult (Z61.09).   Plan Orders Today's instructions / counseling include(s) Lifestyle education regarding diet.       3. Assessment Cervical radiculopathy (M54.12).       4. Assessment Lumbar radiculopathy (M54.16).         Pain Assessment/Treatment Pain Scale: 9/10. Method: Numeric Pain Intensity Scale. Location: lower back. Onset: 06/28/2015. Duration: varies. Quality: discomforting. Pain Assessment/Treatment follow-up plan of care: Patient taking medication as prescribed..  Fall Risk Plan The patient has not fallen in the last year.  Order for lumbar and cervical X-rays. Schedule cervical MRI. Follow up to discuss.  Orders: Diagnostic Procedures: Assessment Procedure  M54.12 Cervical Spine- AP/Lat/Obls/Odontoid/Flex/Ex  M54.16 Lumbar Spine- AP/Lat/Obls/Spot/Flex/Ex  Instruction(s)/Education: Assessment Instruction  I10 Hypertension education  [phone] Lifestyle education regarding diet             Provider:  Danae Orleans. Venetia Maxon MD  10/26/2015 05:05 PM Dictation edited by: Jamey Ripa    CC Providers: Collene Leyden [address] [place], [place] [address]-              Electronically signed by Danae Orleans Venetia Maxon MD on 10/27/2015 03:02 PM

## 2016-01-17 NOTE — Op Note (Signed)
01/17/2016  5:15 PM  PATIENT:  Dawn Joseph  75 y.o. 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  PROCEDURE:  Procedure(s): Cervical four-five Cervical five-six Cervical six-seven Anterior cervical decompression/diskectomy/fusion (N/A) with PEEK cages, autograft, plate  SURGEON:  Surgeon(s) and Role:    * Maeola Harman, MD - Primary    * Lisbeth Renshaw, MD - Assisting  PHYSICIAN ASSISTANT:   ASSISTANTS: Poteat, RN   ANESTHESIA:   general  EBL:  Total I/O In: -  Out: 100 [Blood:100]  BLOOD ADMINISTERED:none  DRAINS: none   LOCAL MEDICATIONS USED:  LIDOCAINE   SPECIMEN:  No Specimen  DISPOSITION OF SPECIMEN:  N/A  COUNTS:  YES  TOURNIQUET:  * No tourniquets in log *  DICTATION: Patient was brought to operating room and following the smooth and uncomplicated induction of general endotracheal anesthesia his head was placed on a horseshoe head holder she was placed in 5 pounds of Holter traction and his anterior neck was prepped and draped in usual sterile fashion. An incision was made on the left side of midline after infiltrating the skin and subcutaneous tissues with local lidocaine. The platysmal layer was incised and subplatysmal dissection was performed exposing the anterior border sternocleidomastoid muscle. Using blunt dissection the carotid sheath was kept lateral and trachea and esophagus kept medial exposing the anterior cervical spine. A bent spinal needle was placed it was felt to be the C 34 and C 45 levels and this was confirmed on intraoperative x-ray. Longus coli muscles were taken down from the anterior cervical spine using electrocautery and key elevator and self-retaining retractor was placed. The interspace at C6-7 was incised and a  thorough discectomy was performed. Distraction pins were placed. Uncinate spurs and central spondylitic ridges were drilled down with a high-speed drill. The spinal cord dura and both C7 nerve roots were widely decompressed. Hemostasis was assured. After trial sizing an 5 mm lordotic peek interbody cage was selected and packed with locally harvested autograft. The graft was tamped into position and countersunk appropriately. The retractor was moved and the interspace at C5-6 was incised and a thorough discectomy was performed. Distraction pins were placed. Uncinate spurs and central spondylitic ridges were drilled down with a high-speed drill. The spinal cord dura and both C6 nerve roots were widely decompressed. Hemostasis was assured.  After trial sizing an 5 mm lordotic peek interbody cage was selected and packed with locally harvested autograft. The graft was tamped into position and countersunk appropriately. The graft was tamped into position and countersunk appropriately.The interspace at C4-5 was incised and a thorough discectomy was performed. Distraction pins were placed. Uncinate spurs and central spondylitic ridges were drilled down with a high-speed drill. The spinal cord dura and both C5 nerve roots were widely decompressed. Hemostasis was assured. After trial sizing an 4 mm lordotic peek interbody cage was selected and packed with locally harvested autograft. The graft was tamped into position and countersunk appropriately.  Distraction weight was removed. A 42 mm Aviator anterior cervical plate was affixed to the cervical spine with 12 mm variable-angle screws 2 at C4, 2 at C5, 2 at C6,  and 2 at C7. All screws were well-positioned and locking mechanisms were engaged. Soft tissues were inspected and found to be in good repair. The wound was  irrigated. A final x-ray was obtained with good visualization at C4 with the interbody graft well visualized. The platysma layer was closed with 3-0 Vicryl  stitches and the skin was reapproximated with 3-0 Vicryl subcuticular stitches. The wound was dressed with Dermabond and an occlusive dressing. Counts were correct at the end of the case. Patient was extubated and taken to recovery in stable and satisfactory condition.    PLAN OF CARE: Admit to inpatient   PATIENT DISPOSITION:  PACU - hemodynamically stable.   Delay start of Pharmacological VTE agent (>24hrs) due to surgical blood loss or risk of bleeding: yes

## 2016-01-17 NOTE — Anesthesia Procedure Notes (Signed)
Procedure Name: Intubation Date/Time: 01/17/2016 2:33 PM Performed by: Dairl PonderJIANG, Kristi Norment Pre-anesthesia Checklist: Patient identified, Timeout performed, Emergency Drugs available, Suction available and Patient being monitored Patient Re-evaluated:Patient Re-evaluated prior to inductionOxygen Delivery Method: Circle system utilized Preoxygenation: Pre-oxygenation with 100% oxygen Intubation Type: IV induction Ventilation: Mask ventilation without difficulty and Oral airway inserted - appropriate to patient size Laryngoscope Size: 3 and Glidescope (limited  neck ROM) Grade View: Grade I Tube type: Oral Tube size: 7.0 mm Number of attempts: 1 Airway Equipment and Method: Stylet Placement Confirmation: ETT inserted through vocal cords under direct vision,  breath sounds checked- equal and bilateral and positive ETCO2 Secured at: 22 cm Tube secured with: Tape Dental Injury: Teeth and Oropharynx as per pre-operative assessment

## 2016-01-17 NOTE — Brief Op Note (Signed)
01/17/2016  5:15 PM  PATIENT:  Dawn Joseph  74 y.o. 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  PROCEDURE:  Procedure(s): Cervical four-five Cervical five-six Cervical six-seven Anterior cervical decompression/diskectomy/fusion (N/A) with PEEK cages, autograft, plate  SURGEON:  Surgeon(s) and Role:    * Falesha Schommer, MD - Primary    * Neelesh Nundkumar, MD - Assisting  PHYSICIAN ASSISTANT:   ASSISTANTS: Poteat, RN   ANESTHESIA:   general  EBL:  Total I/O In: -  Out: 100 [Blood:100]  BLOOD ADMINISTERED:none  DRAINS: none   LOCAL MEDICATIONS USED:  LIDOCAINE   SPECIMEN:  No Specimen  DISPOSITION OF SPECIMEN:  N/A  COUNTS:  YES  TOURNIQUET:  * No tourniquets in log *  DICTATION: Patient was brought to operating room and following the smooth and uncomplicated induction of general endotracheal anesthesia his head was placed on a horseshoe head holder she was placed in 5 pounds of Holter traction and his anterior neck was prepped and draped in usual sterile fashion. An incision was made on the left side of midline after infiltrating the skin and subcutaneous tissues with local lidocaine. The platysmal layer was incised and subplatysmal dissection was performed exposing the anterior border sternocleidomastoid muscle. Using blunt dissection the carotid sheath was kept lateral and trachea and esophagus kept medial exposing the anterior cervical spine. A bent spinal needle was placed it was felt to be the C 34 and C 45 levels and this was confirmed on intraoperative x-ray. Longus coli muscles were taken down from the anterior cervical spine using electrocautery and key elevator and self-retaining retractor was placed. The interspace at C6-7 was incised and a  thorough discectomy was performed. Distraction pins were placed. Uncinate spurs and central spondylitic ridges were drilled down with a high-speed drill. The spinal cord dura and both C7 nerve roots were widely decompressed. Hemostasis was assured. After trial sizing an 5 mm lordotic peek interbody cage was selected and packed with locally harvested autograft. The graft was tamped into position and countersunk appropriately. The retractor was moved and the interspace at C5-6 was incised and a thorough discectomy was performed. Distraction pins were placed. Uncinate spurs and central spondylitic ridges were drilled down with a high-speed drill. The spinal cord dura and both C6 nerve roots were widely decompressed. Hemostasis was assured.  After trial sizing an 5 mm lordotic peek interbody cage was selected and packed with locally harvested autograft. The graft was tamped into position and countersunk appropriately. The graft was tamped into position and countersunk appropriately.The interspace at C4-5 was incised and a thorough discectomy was performed. Distraction pins were placed. Uncinate spurs and central spondylitic ridges were drilled down with a high-speed drill. The spinal cord dura and both C5 nerve roots were widely decompressed. Hemostasis was assured. After trial sizing an 4 mm lordotic peek interbody cage was selected and packed with locally harvested autograft. The graft was tamped into position and countersunk appropriately.  Distraction weight was removed. A 42 mm Aviator anterior cervical plate was affixed to the cervical spine with 12 mm variable-angle screws 2 at C4, 2 at C5, 2 at C6,  and 2 at C7. All screws were well-positioned and locking mechanisms were engaged. Soft tissues were inspected and found to be in good repair. The wound was   irrigated. A final x-ray was obtained with good visualization at C4 with the interbody graft well visualized. The platysma layer was closed with 3-0 Vicryl  stitches and the skin was reapproximated with 3-0 Vicryl subcuticular stitches. The wound was dressed with Dermabond and an occlusive dressing. Counts were correct at the end of the case. Patient was extubated and taken to recovery in stable and satisfactory condition.    PLAN OF CARE: Admit to inpatient   PATIENT DISPOSITION:  PACU - hemodynamically stable.   Delay start of Pharmacological VTE agent (>24hrs) due to surgical blood loss or risk of bleeding: yes

## 2016-01-18 ENCOUNTER — Encounter (HOSPITAL_COMMUNITY): Payer: Self-pay | Admitting: Neurosurgery

## 2016-01-18 LAB — GLUCOSE, CAPILLARY: Glucose-Capillary: 163 mg/dL — ABNORMAL HIGH (ref 65–99)

## 2016-01-18 MED ORDER — TIZANIDINE HCL 2 MG PO TABS: 2.0000 mg | ORAL_TABLET | Freq: Four times a day (QID) | ORAL | Status: AC | PRN

## 2016-01-18 MED ORDER — FAMOTIDINE 20 MG PO TABS
20.0000 mg | ORAL_TABLET | Freq: Two times a day (BID) | ORAL | Status: DC
  Administered 2016-01-18: 20 mg via ORAL
  Filled 2016-01-18: qty 1

## 2016-01-18 MED ORDER — OXYCODONE-ACETAMINOPHEN 5-325 MG PO TABS: 1.0000 | ORAL_TABLET | ORAL | Status: AC | PRN

## 2016-01-18 NOTE — Progress Notes (Signed)
Subjective: Patient reports doing well.  Arm pain resolved.  Objective: Vital signs in last 24 hours: Temp:  [97.3 F (36.3 C)-98.7 F (37.1 C)] 98.3 F (36.8 C) (06/21 0400) Pulse Rate:  [57-81] 65 (06/21 0400) Resp:  [14-38] 18 (06/21 0400) BP: (129-196)/(63-106) 160/80 mmHg (06/21 0531) SpO2:  [85 %-99 %] 97 % (06/21 0400) Weight:  [89.812 kg (198 lb)] 89.812 kg (198 lb) (06/20 1047)  Intake/Output from previous day: 06/20 0701 - 06/21 0700 In: 365 [P.O.:240; I.V.:125] Out: 250 [Urine:150; Blood:100] Intake/Output this shift:    Physical Exam: Strength full bilateral D/B/T/HI.  MAEW.  Dressing CDI.  Lab Results: No results for input(s): WBC, HGB, HCT, PLT in the last 72 hours. BMET No results for input(s): NA, K, CL, CO2, GLUCOSE, BUN, CREATININE, CALCIUM in the last 72 hours.  Studies/Results: Dg Cervical Spine 2-3 Views  01/17/2016  CLINICAL DATA:  C4-C7 ACDF. EXAM: CERVICAL SPINE - 2-3 VIEW COMPARISON:  None. FINDINGS: Two cross-table lateral portable cervical spine films demonstrate a surgical instrument with tip over the anterior aspect of the disc space at the C4-5 level as the subsequent image demonstrates anterior fusion hardware with intact screws at the C4-5 level. Minimal spondylosis of the cervical spine. Remainder of the exam is unremarkable. IMPRESSION: Anterior fusion at the C4-5 level with hardware intact. Electronically Signed   By: Elberta Fortisaniel  Boyle M.D.   On: 01/17/2016 16:52    Assessment/Plan: Doing well.  Discharge home.  Will need F/U with her physician regarding BP control.  She says it runs the same at home and has been difficult to control.      LOS: 1 day    Dorian HeckleSTERN,Maile Linford D, MD 01/18/2016, 7:29 AM

## 2016-01-18 NOTE — Progress Notes (Signed)
Patient alert and oriented, mae's well, voiding adequate amount of urine, swallowing without difficulty, c/o mild pain and medication given prior to discharged.. Patient discharged home with family. Script and discharged instructions given to patient. Patient and family stated understanding of d/c instructions given and has an appointment with MD. 

## 2016-01-18 NOTE — Discharge Summary (Signed)
Physician Discharge Summary  Patient ID: Dawn Joseph MRN: [id number] DOB/AGE: Oct 19, 1940 75 y.o.  Admit date: 01/17/2016 Discharge date: 01/18/2016  Admission Diagnoses:Herniated cervical disc with stenosis and myelopathy C 45, C 56, C 67 levels  Discharge Diagnoses: Herniated cervical disc with stenosis and myelopathy C 45, C 56, C 67 levels Active Problems:   Spinal stenosis of cervical region   Discharged Condition: good  Hospital Course: Patient underwent Anterior Cervical Decompression and Fusion C 45, C 56, C 67 levels with good relief of arm pain and weakness.  She did well postoperatively.  BP run high, but patient states this has been a problem for her at home as well.  I have instructed her to follow up with her Internist as an outpatient regarding her BP control.  Consults: None  Significant Diagnostic Studies: None  Treatments: surgery: Anterior Cervical Decompression and Fusion C 45, C 56, C 67 levels  Discharge Exam: Blood pressure 160/80, pulse 65, temperature 98.3 F (36.8 C), temperature source Oral, resp. rate 18, height 5\' 1"  (1.549 m), weight 89.812 kg (198 lb), SpO2 97 %. Neurologic: Alert and oriented X 3, normal strength and tone. Normal symmetric reflexes. Normal coordination and gait Wound:CDI  Disposition: Home     Medication List    TAKE these medications        acetaZOLAMIDE 250 MG tablet  Commonly known as:  DIAMOX  Take 250 mg by mouth daily.     aspirin 81 MG tablet  Take 81 mg by mouth daily.     atorvastatin 40 MG tablet  Commonly known as:  LIPITOR  Take 40 mg by mouth daily.     carvedilol 25 MG tablet  Commonly known as:  COREG  Take 25 mg by mouth 2 (two) times daily with a meal.     Co Q 10 100 MG Caps  Take 1 capsule by mouth daily.     ferrous sulfate 325 (65 FE) MG tablet  Take 325 mg by mouth 2 (two) times daily with a meal.     Fish Oil 1000 MG Caps  Take 1 capsule by mouth daily.     furosemide 40 MG tablet   Commonly known as:  LASIX  Take 20 mg by mouth daily.     isosorbide mononitrate 120 MG 24 hr tablet  Commonly known as:  IMDUR  Take 120 mg by mouth daily.     isosorbide mononitrate 60 MG 24 hr tablet  Commonly known as:  IMDUR  Take 60 mg by mouth daily.     levothyroxine 50 MCG tablet  Commonly known as:  SYNTHROID, LEVOTHROID  Take 50 mcg by mouth daily before breakfast.     loratadine 10 MG tablet  Commonly known as:  CLARITIN  Take 10 mg by mouth daily.     meclizine 25 MG tablet  Commonly known as:  ANTIVERT  Take 12.5 mg by mouth daily.     nateglinide 120 MG tablet  Commonly known as:  STARLIX  Take 120 mg by mouth 2 (two) times daily with a meal.     omeprazole 40 MG capsule  Commonly known as:  PRILOSEC  Take 40 mg by mouth daily.     oxyCODONE-acetaminophen 5-325 MG tablet  Commonly known as:  PERCOCET/ROXICET  Take 1-2 tablets by mouth every 4 (four) hours as needed for moderate pain.     potassium chloride 10 MEQ tablet  Commonly known as:  K-DUR  Take 10 mEq by mouth  daily.     tiZANidine 2 MG tablet  Commonly known as:  ZANAFLEX  Take 1-2 tablets (2-4 mg total) by mouth every 6 (six) hours as needed for muscle spasms.     vitamin B-12 500 MCG tablet  Commonly known as:  CYANOCOBALAMIN  Take 500 mcg by mouth daily.     Vitamin D 2000 units tablet  Take 2,000 Units by mouth daily.     ZENPEP 20000 units Cpep  Generic drug:  Pancrelipase (Lip-Prot-Amyl)  Take 1 capsule by mouth daily.           Follow-up Information    Follow up with Dorian Heckle, MD.   Specialty:  Neurosurgery   Contact information:   [address] [place] [address] [phone]       Signed: Dorian Heckle, MD 01/18/2016, 7:31 AM

## 2016-01-18 NOTE — Anesthesia Postprocedure Evaluation (Signed)
Anesthesia Post Note  Patient: Dawn Joseph  Procedure(s) Performed: Procedure(s) (LRB): Cervical four-five Cervical five-six Cervical six-seven Anterior cervical decompression/diskectomy/fusion (N/A)  Patient location during evaluation: PACU Anesthesia Type: General Level of consciousness: awake and alert Pain management: pain level controlled Vital Signs Assessment: post-procedure vital signs reviewed and stable Respiratory status: spontaneous breathing, nonlabored ventilation, respiratory function stable and patient connected to nasal cannula oxygen Cardiovascular status: blood pressure returned to baseline and stable Postop Assessment: no signs of nausea or vomiting Anesthetic complications: no    Last Vitals:  Filed Vitals:   01/18/16 0531 01/18/16 0833  BP: 160/80 147/53  Pulse:  60  Temp:  36.9 C  Resp:  18    Last Pain:  Filed Vitals:   01/18/16 0835  PainSc: Asleep                 Cecile HearingStephen Edward Turk

## 2016-01-18 NOTE — Progress Notes (Signed)
Occupational Therapy Evaluation Patient Details Name: Ann Bohne MRN: [id number] DOB: 11-Mar-1941 Today's Date: 01/18/2016    History of Present Illness Pt is a 75 y/o female who presents s/p C4-C7 ACDF on 01/17/16.   Clinical Impression   Pt seen x 2 for evaluation and treatment regarding cervical precautions and management of ADL and functional mobility. Educated family on need to assist pt with ADL at this time, however, pt issued AE to use which she can be mod I with once she works with [employer]. Pt safe to D/C home when medically stable.     Follow Up Recommendations  Home health OT;Supervision - Intermittent    Equipment Recommendations  3 in 1 bedside comode;Tub/shower bench    Recommendations for Other Services       Precautions / Restrictions Precautions Precautions: Fall;Cervical Precaution Comments: Handout issued and reviewed. Pt was cued for precautions during functional mobility.  Required Braces or Orthoses: Cervical Brace Cervical Brace: Hard collar Restrictions Weight Bearing Restrictions: No      Mobility Bed Mobility Overal bed mobility: Needs Assistance Bed Mobility: Rolling;Sidelying to Sit;Sit to Sidelying Rolling: Min guard Sidelying to sit: Min assist     Sit to sidelying: Min assist General bed mobility comments: OOB in chair  Transfers Overall transfer level: Needs assistance Equipment used: None;Rolling walker (2 wheeled) Transfers: Sit to/from Stand Sit to Stand: Min guard         General transfer comment: cues to not bend over when standing    Balance Overall balance assessment: Needs assistance Sitting-balance support: Feet supported;No upper extremity supported Sitting balance-Leahy Scale: Good     Standing balance support: No upper extremity supported;During functional activity Standing balance-Leahy Scale: Fair                              ADL Overall ADL's : Needs assistance/impaired         Upper Body  Bathing: Supervision/ safety;Set up;Sitting   Lower Body Bathing: Minimal assistance;Sit to/from stand   Upper Body Dressing : Minimal assistance Upper Body Dressing Details (indicate cue type and reason): Assist to donn/doff C collar Lower Body Dressing: Moderate assistance;Sit to/from stand   Toilet Transfer: Min guard;Ambulation;BSC (over toilet)   Toileting- Clothing Manipulation and Hygiene: Supervision/safety;Sit to/from stand       Functional mobility during ADLs: Min guard General ADL Comments: Began education regarding compensatory techniques and use of AE and DME. Pt unsafe to step over tub and needs to use tub bench. Pt will also benefit from use of AE.    Second visit - completed education with pt/family regarding cervical precautions; use of DME and AE for ADL. Educated on reducing risk of falls and need for use of tub bench for tub transfers. Educated family on need for S for tub transfers at this time. Pt issued AE for LB ADL to increase independence and safety with ADL     Vision  wars glasses   Perception     Praxis      Pertinent Vitals/Pain Pain Assessment: Faces Faces Pain Scale: Hurts even more Pain Location: neck Pain Descriptors / Indicators: Discomfort;Sore;Operative site guarding Pain Intervention(s): Limited activity within patient's tolerance     Hand Dominance     Extremity/Trunk Assessment Upper Extremity Assessment Upper Extremity Assessment: Generalized weakness   Lower Extremity Assessment Lower Extremity Assessment: Generalized weakness   Cervical / Trunk Assessment Cervical / Trunk Assessment: Other exceptions Cervical / Trunk Exceptions:  Cervical surgical incision site. General forward head/rounded shoulders posture.    Communication Communication Communication: No difficulties   Cognition Arousal/Alertness: Awake/alert Behavior During Therapy: WFL for tasks assessed/performed Overall Cognitive Status: Within Functional Limits  for tasks assessed                     General Comments       Exercises       Shoulder Instructions      Home Living Family/patient expects to be discharged to:: Private residence Living Arrangements: Alone Available Help at Discharge: Family;Available PRN/intermittently Type of Home: House Home Access: Stairs to enter Entergy CorporationEntrance Stairs-Number of Steps: 3 Entrance Stairs-Rails: Right Home Layout: One level     Bathroom Shower/Tub: Tub/shower unit Shower/tub characteristics: Engineer, building servicesCurtain Bathroom Toilet: Standard Bathroom Accessibility: Yes How Accessible: Accessible via walker Home Equipment: Walker - 2 wheels;Cane - single point;Shower seat;Wheelchair - manual          Prior Functioning/Environment Level of Independence: Needs assistance  Gait / Transfers Assistance Needed: Using RW all the time ADL's / Homemaking Assistance Needed: Pt states she was not able to elevate LE's over edge of tub to wash and was sponge bathing at the sink.    Comments: States LB ADL and IADL tasks had become more difficult    OT Diagnosis: Generalized weakness;Acute pain   OT Problem List: Decreased strength;Decreased activity tolerance;Impaired balance (sitting and/or standing);Decreased safety awareness;Decreased knowledge of use of DME or AE;Obesity;Pain;Decreased knowledge of precautions   OT Treatment/Interventions: Self-care/ADL training;DME and/or AE instruction;Therapeutic activities;Patient/family education    OT Goals(Current goals can be found in the care plan section) Acute Rehab OT Goals Patient Stated Goal: Home at d/c and to have an aide for assist OT Goal Formulation: With patient Time For Goal Achievement: 01/25/16  OT Frequency: Min 2X/week   Barriers to D/C: Decreased caregiver support          Co-evaluation              End of Session Equipment Utilized During Treatment: Cervical collar Nurse Communication: Mobility status;Other (comment) (need for  tub bench and 3 in1)  Activity Tolerance: Patient tolerated treatment well Patient left: Other (comment) (on toilet. Nsg notified)   Time: 0955-1013(1rst visit); [id number] (2nd visit) OT Time Calculation (min): 18 min (first visit)13 min(2nd visit) Charges:  OT General Charges $OT Visit: 2 Procedure OT Evaluation $OT Eval Moderate Complexity: 1 Procedure OT Treatments $Self Care/Home Management : 8-22 mins G-Codes:    Anaya Bovee,HILLARY 01/18/2016, 12:02 PM   Osmara Drummonds, OTR/L  [phone] 01/18/2016

## 2016-01-18 NOTE — Evaluation (Signed)
Physical Therapy Evaluation Patient Details Name: Dawn Joseph MRN: [id number] DOB: Jun 08, 1941 Today's Date: 01/18/2016   History of Present Illness  Pt is a 75 y/o female who presents s/p C4-C7 ACDF on 01/17/16.  Clinical Impression  Pt admitted with above diagnosis. Pt currently with functional limitations due to the deficits listed below (see PT Problem List). At the time of PT eval pt was able to demonstrate transfers and ambulation with min assist to min guard assist for safety. Pt will need further instruction with proper bed mobility/log roll technique. Does not appear that pt does not have consistent assistance planned at d/c, and encouraged her to talk with family regarding increased supervision. Pt will benefit from skilled PT to increase their independence and safety with mobility to allow discharge to the venue listed below.       Follow Up Recommendations Home health PT;Supervision for mobility/OOB; Aide    Equipment Recommendations  None recommended by PT    Recommendations for Other Services       Precautions / Restrictions Precautions Precautions: Fall;Cervical Precaution Comments: Handout issued and reviewed. Pt was cued for precautions during functional mobility.  Required Braces or Orthoses: Cervical Brace Cervical Brace: Hard collar Restrictions Weight Bearing Restrictions: No      Mobility  Bed Mobility Overal bed mobility: Needs Assistance Bed Mobility: Rolling;Sidelying to Sit;Sit to Sidelying Rolling: Min guard Sidelying to sit: Min assist     Sit to sidelying: Min assist General bed mobility comments: Pt with overall poor understanding of log roll and required extensive education of precautions with bed mobility. Will need further review.   Transfers Overall transfer level: Needs assistance Equipment used: None;Rolling walker (2 wheeled) Transfers: Sit to/from Stand Sit to Stand: Min guard         General transfer comment: Pt able to power-up to  full standing position without physical assistance. Hands-on guarding provided for safety. Pt stood at recliner with no AD, and later in session to the RW. Min guard provided each time.   Ambulation/Gait Ambulation/Gait assistance: Min guard Ambulation Distance (Feet): 200 Feet Assistive device: Rolling walker (2 wheeled) Gait Pattern/deviations: Decreased stride length;Shuffle;Trunk flexed Gait velocity: Decreased Gait velocity interpretation: Below normal speed for age/gender General Gait Details: VC's for improved posture and general safety with the RW.   Stairs Stairs: Yes Stairs assistance: Min guard Stair Management: One rail Right;Step to pattern;Sideways Number of Stairs: 4 General stair comments: VC's for sequencing and safety with stair negotiation. Specific cueing to avoid twisting when holding to R rail with both hands.   Wheelchair Mobility    Modified Rankin (Stroke Patients Only)       Balance Overall balance assessment: Needs assistance Sitting-balance support: Feet supported;No upper extremity supported Sitting balance-Leahy Scale: Fair     Standing balance support: No upper extremity supported;During functional activity Standing balance-Leahy Scale: Fair                               Pertinent Vitals/Pain Pain Assessment: Faces Faces Pain Scale: Hurts even more Pain Location: Incision site Pain Descriptors / Indicators: Operative site guarding;Sore Pain Intervention(s): Limited activity within patient's tolerance;Monitored during session;Repositioned    Home Living Family/patient expects to be discharged to:: Private residence Living Arrangements: Alone Available Help at Discharge: Family;Available PRN/intermittently Type of Home: House Home Access: Stairs to enter Entrance Stairs-Rails: Right Entrance Stairs-Number of Steps: 3 Home Layout: One level Home Equipment: Walker - 2 wheels;Cane -  single point;Shower seat;Wheelchair -  manual      Prior Function Level of Independence: Needs assistance   Gait / Transfers Assistance Needed: Using RW all the time  ADL's / Homemaking Assistance Needed: Pt states she was not able to elevate LE's over edge of tub to wash and was sponge bathing at the sink.         Hand Dominance        Extremity/Trunk Assessment   Upper Extremity Assessment: Defer to OT evaluation           Lower Extremity Assessment: Generalized weakness      Cervical / Trunk Assessment: Other exceptions  Communication   Communication: No difficulties  Cognition Arousal/Alertness: Awake/alert Behavior During Therapy: WFL for tasks assessed/performed Overall Cognitive Status: Within Functional Limits for tasks assessed                      General Comments      Exercises        Assessment/Plan    PT Assessment Patient needs continued PT services  PT Diagnosis Difficulty walking;Acute pain   PT Problem List Decreased range of motion;Decreased strength;Decreased activity tolerance;Decreased balance;Decreased mobility;Decreased knowledge of use of DME;Decreased safety awareness;Decreased knowledge of precautions;Pain  PT Treatment Interventions DME instruction;Gait training;Stair training;Functional mobility training;Therapeutic activities;Therapeutic exercise;Neuromuscular re-education;Patient/family education   PT Goals (Current goals can be found in the Care Plan section) Acute Rehab PT Goals Patient Stated Goal: Home at d/c and to have an aide for assist PT Goal Formulation: With patient Time For Goal Achievement: 01/25/16 Potential to Achieve Goals: Good    Frequency Min 5X/week   Barriers to discharge Decreased caregiver support Intermittent family support. Encouraged pt to try and work out increased assist from family prior to d/c.     Co-evaluation               End of Session Equipment Utilized During Treatment: Gait belt;Cervical collar Activity  Tolerance: Patient limited by fatigue Patient left: in chair;with call bell/phone within reach Nurse Communication: Mobility status         Time: [id number] PT Time Calculation (min) (ACUTE ONLY): 26 min   Charges:   PT Evaluation $PT Eval Moderate Complexity: 1 Procedure PT Treatments $Gait Training: 8-22 mins   PT G Codes:        Dawn Joseph, Dawn Joseph 01/18/2016, 9:14 AM  Dawn Joseph, PT, DPT Acute Rehabilitation Services Pager: [phone]

## 2016-01-18 NOTE — Care Management Note (Signed)
75 yr old female s/p Case Management Note  Patient Details  Name: Dawn Joseph MRN: [id number] Date of Birth: [birth year]  Subjective/Objective:  75 yr old female s/p C4-7 ACDF.               Action/Plan: Case manager spoke with patient concerning home health and DME needs. Choice was offered for home health agency. [facility] [phone], orders were faxed to her at [phone]. Patient will have family assistance at discharge. .  Expected Discharge Date:   01/18/16               Expected Discharge Plan:  Home w Home Health Services  In-House Referral:     Discharge planning Services     Post Acute Care Choice:  Durable Medical Equipment, Home Health Choice offered to:  Patient  DME Arranged:  3-N-1, Tub bench DME Agency:  [facility]  HH Arranged:  PT, OT, Nurse's Aide HH [facility]  Status of Service:  Completed, signed off  If discussed at Long Length of Stay Meetings, dates discussed:    Additional Comments:  Durenda GuthrieBrady, Marisal Swarey Naomi, RN 01/18/2016, 11:35 AM

## 2017-06-16 IMAGING — CR DG CERVICAL SPINE 2 OR 3 VIEWS
1 series · 1 of 1 positions shown · non-contrast
Comparison: None.

CLINICAL DATA: C4-C7 ACDF.

EXAM:
CERVICAL SPINE - 2-3 VIEW

[lat]
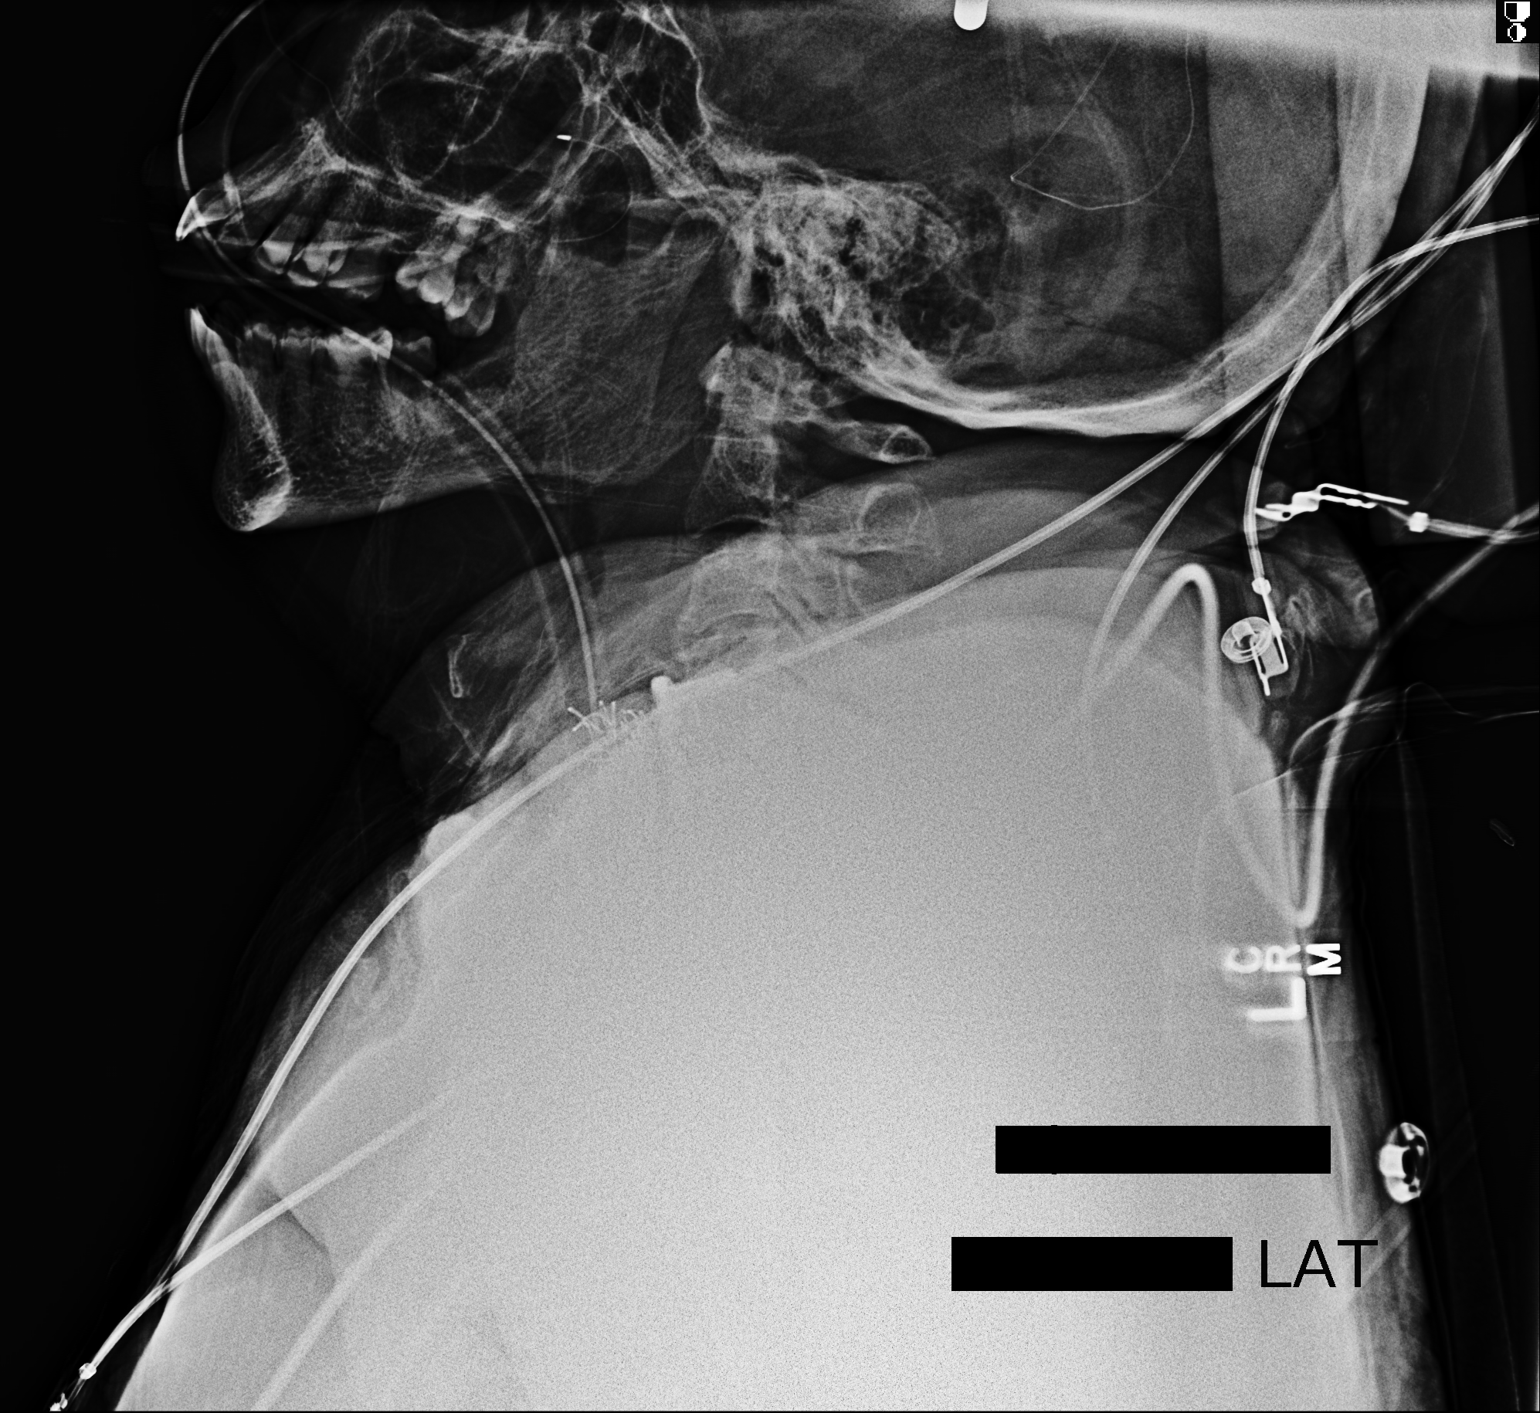

[1 of 1 positions shown; findings below may reference images not displayed]

FINDINGS: Two cross-table lateral portable cervical spine films demonstrate a
surgical instrument with tip over the anterior aspect of the disc
space at the C4-5 level as the subsequent image demonstrates
anterior fusion hardware with intact screws at the C4-5 level.
Minimal spondylosis of the cervical spine. Remainder of the exam is
unremarkable.
IMPRESSION: Anterior fusion at the C4-5 level with hardware intact.
